# Patient Record
Sex: Male | Born: 1960 | Race: White | Hispanic: No | Smoking: Current every day smoker
Health system: Southern US, Community
[De-identification: ages and names within clinical notes are randomized; demographics above are authoritative.]

## PROBLEM LIST (undated history)

## (undated) DIAGNOSIS — N529 Male erectile dysfunction, unspecified: Secondary | ICD-10-CM

## (undated) DIAGNOSIS — C61 Malignant neoplasm of prostate: Secondary | ICD-10-CM

## (undated) DIAGNOSIS — E785 Hyperlipidemia, unspecified: Secondary | ICD-10-CM

## (undated) DIAGNOSIS — I10 Essential (primary) hypertension: Secondary | ICD-10-CM

## (undated) HISTORY — PX: OTHER SURGICAL HISTORY: SHX169

## (undated) HISTORY — DX: Hyperlipidemia, unspecified: E78.5

## (undated) HISTORY — DX: Essential (primary) hypertension: I10

## (undated) HISTORY — PX: TONSILLECTOMY: SUR1361

## (undated) HISTORY — DX: Male erectile dysfunction, unspecified: N52.9

## (undated) HISTORY — DX: Malignant neoplasm of prostate: C61

---

## 2012-04-08 ENCOUNTER — Ambulatory Visit (INDEPENDENT_AMBULATORY_CARE_PROVIDER_SITE_OTHER): Payer: PRIVATE HEALTH INSURANCE | Admitting: Family Medicine

## 2012-04-08 VITALS — BP 137/87 | HR 73 | Temp 97.6°F | Resp 16 | Ht 73.0 in | Wt 173.0 lb

## 2012-04-08 DIAGNOSIS — Z720 Tobacco use: Secondary | ICD-10-CM | POA: Insufficient documentation

## 2012-04-08 DIAGNOSIS — Z79899 Other long term (current) drug therapy: Secondary | ICD-10-CM

## 2012-04-08 DIAGNOSIS — R972 Elevated prostate specific antigen [PSA]: Secondary | ICD-10-CM | POA: Insufficient documentation

## 2012-04-08 DIAGNOSIS — I10 Essential (primary) hypertension: Secondary | ICD-10-CM

## 2012-04-08 DIAGNOSIS — E785 Hyperlipidemia, unspecified: Secondary | ICD-10-CM

## 2012-04-08 DIAGNOSIS — Z760 Encounter for issue of repeat prescription: Secondary | ICD-10-CM

## 2012-04-08 MED ORDER — AMLODIPINE BESYLATE 2.5 MG PO TABS: 2.5000 mg | ORAL_TABLET | Freq: Every day | ORAL | Status: DC

## 2012-04-08 MED ORDER — LOSARTAN POTASSIUM-HCTZ 100-25 MG PO TABS: 1.0000 | ORAL_TABLET | Freq: Every day | ORAL | Status: DC

## 2012-04-08 NOTE — Patient Instructions (Addendum)
Cholesterol Cholesterol is a white, waxy, fat-like protein needed by your body in small amounts. The liver makes all the cholesterol you need. It is carried from the liver by the blood through the blood vessels. Deposits (plaque) may build up on blood vessel walls. This makes the arteries narrower and stiffer. Plaque increases the risk for heart attack and stroke. You cannot feel your cholesterol level even if it is very high. The only way to know is by a blood test to check your lipid (fats) levels. Once you know your cholesterol levels, you should keep a record of the test results. Work with your caregiver to to keep your levels in the desired range. WHAT THE RESULTS MEAN:  Total cholesterol is a rough measure of all the cholesterol in your blood.   LDL is the so-called bad cholesterol. This is the type that deposits cholesterol in the walls of the arteries. You want this level to be low.   HDL is the good cholesterol because it cleans the arteries and carries the LDL away. You want this level to be high.   Triglycerides are fat that the body can either burn for energy or store. High levels are closely linked to heart disease.  DESIRED LEVELS:  Total cholesterol below 200.   LDL below 100 for people at risk, below 70 for very high risk.   HDL above 50 is good, above 60 is best.   Triglycerides below 150.  HOW TO LOWER YOUR CHOLESTEROL:  Diet.   Choose fish or white meat chicken and [place], roasted or baked. Limit fatty cuts of red meat, fried foods, and processed meats, such as sausage and lunch meat.   Eat lots of fresh fruits and vegetables. Choose whole grains, beans, pasta, potatoes and cereals.   Use only small amounts of olive, corn or canola oils. Avoid butter, mayonnaise, shortening or palm kernel oils. Avoid foods with trans-fats.   Use skim/nonfat milk and low-fat/nonfat yogurt and cheeses. Avoid whole milk, cream, ice cream, egg yolks and cheeses. Healthy desserts include  angel food cake, ginger snaps, animal crackers, hard candy, popsicles, and low-fat/nonfat frozen yogurt. Avoid pastries, cakes, pies and cookies.   Exercise.   A regular program helps decrease LDL and raises HDL.   Helps with weight control.   Do things that increase your activity level like gardening, walking, or taking the stairs.   Medication.   May be prescribed by your caregiver to help lowering cholesterol and the risk for heart disease.   You may need medicine even if your levels are normal if you have several risk factors.  HOME CARE INSTRUCTIONS   Follow your diet and exercise programs as suggested by your caregiver.   Take medications as directed.   Have blood work done when your caregiver feels it is necessary.  MAKE SURE YOU:   Understand these instructions.   Will watch your condition.   Will get help right away if you are not doing well or get worse.  Document Released: 04/04/2001 Document Revised: 06/29/2011 Document Reviewed: 09/25/2007 [facility] Patient Information 2012 [place], [place].Prostate-Specific Antigen The prostate-specific antigen (PSA) is a blood test. It is used to help detect early forms of prostate cancer. The test is usually used along with other tests. The test is also used to follow the course of those who already have prostate cancer or who have been treated for prostate cancer. Some factors interfere with the results of the PSA. The factors listed below will either increase or  decrease the PSA levels. They are:  Prescriptions used for male baldness.   Some herbs.   Active prostate infection.   Prior instrumentation or urinary catheterization.   Ejaculation up to 2 days prior to testing.   A noncancerous enlargement of the prostate.   Inflammation of the prostate.   Active urinary tract infection.  If your test results are elevated, your caregiver will discuss the results with you. Your caregiver will also let you know if more  evaluation is needed. PREPARATION FOR TEST No preparation or fasting is necessary. NORMAL FINDINGS Less than 4 ng/mL or Less than 41mcg/L (SI units) Ranges for normal findings may vary among different laboratories and hospitals. You should always check with your caregiver after having lab work or other tests done to discuss the meaning of your test results and whether your values are considered within normal limits. MEANING OF TEST  A normal value means prostate cancer is less likely. The chance of having prostate cancer increases if the value is between 4 ng/mL and 10 ng/mL. However, further testing will be needed. Values above 10 ng/mL indicate that there is a much higher chance of having prostate cancer (if the above situations that raise PSA are not present). Your caregiver will go over your test results with you and discuss the importance of this test. If this value is elevated, your caregiver may recommend further testing or evaluation. OBTAINING THE TEST RESULTS It is your responsibility to obtain your test results. Ask the lab or department performing the test when and how you will get your results. Document Released: 08/12/2004 Document Revised: 06/29/2011 Document Reviewed: 02/15/2007 [facility] Patient Information 2012 [place], [place].

## 2012-04-09 LAB — COMPREHENSIVE METABOLIC PANEL
AST: 19 U/L (ref 0–37)
Alkaline Phosphatase: 55 U/L (ref 39–117)
BUN: 12 mg/dL (ref 6–23)
Calcium: 9.4 mg/dL (ref 8.4–10.5)
Creat: 1.01 mg/dL (ref 0.50–1.35)
Total Bilirubin: 0.5 mg/dL (ref 0.3–1.2)

## 2012-04-09 LAB — LIPID PANEL
Cholesterol: 224 mg/dL — ABNORMAL HIGH (ref 0–200)
HDL: 46 mg/dL (ref >39)
Total CHOL/HDL Ratio: 4.9 Ratio
Triglycerides: 207 mg/dL — ABNORMAL HIGH (ref <150)
VLDL: 41 mg/dL — ABNORMAL HIGH (ref 0–40)

## 2012-04-10 ENCOUNTER — Other Ambulatory Visit: Payer: Self-pay | Admitting: Family Medicine

## 2012-04-10 DIAGNOSIS — E785 Hyperlipidemia, unspecified: Secondary | ICD-10-CM

## 2012-04-10 MED ORDER — PRAVASTATIN SODIUM 20 MG PO TABS: 20.0000 mg | ORAL_TABLET | Freq: Every day | ORAL | Status: DC

## 2012-04-10 NOTE — Progress Notes (Signed)
Reviewed and agree.

## 2012-04-10 NOTE — Progress Notes (Signed)
  Subjective:    Patient ID: Zachary Vance, male    DOB: [birth year], 51 y.o.   MRN: [id number]  HPI  Pt is doing well, no complaints. Does not watch diet - works in [employer] so no interest in trying a low salt or low fat diet. No prob tolerating losartan. No interest in stopping smoking. Only took simvastatin for a mo last yr - didn't have any refills on it so he just stopped it. Did not know about his elev PSA, never got eval by urology    Review of Systems  Constitutional: Negative for fever, chills, diaphoresis and fatigue.  Respiratory: Negative for chest tightness and shortness of breath.   Cardiovascular: Negative for chest pain, palpitations and leg swelling.  Gastrointestinal: Negative for anal bleeding and rectal pain.  Genitourinary: Negative for urgency, frequency, decreased urine volume and difficulty urinating.  Musculoskeletal: Negative for gait problem.       Objective:   Physical Exam  Constitutional: He is oriented to person, place, and time. He appears well-developed and well-nourished. No distress.  HENT:  Head: Normocephalic and atraumatic.  Right Ear: External ear normal.  Left Ear: External ear normal.  Eyes: Conjunctivae normal are normal. No scleral icterus.  Neck: Normal range of motion. Neck supple. No thyromegaly present.  Cardiovascular: Normal rate, regular rhythm, normal heart sounds and intact distal pulses.   Pulmonary/Chest: Effort normal and breath sounds normal. No respiratory distress.  Musculoskeletal: He exhibits no edema.  Lymphadenopathy:    He has no cervical adenopathy.  Neurological: He is alert and oriented to person, place, and time.  Skin: Skin is warm and dry. He is not diaphoretic.  Psychiatric: He has a normal mood and affect.          Assessment & Plan:  1. HTN - has been mildly elevated on max dose losartan-hctz today and at sev prev visits. Pt has no interest in TLC but is fine w/ starting another med.  Trial of low dose ccb - amlodipine. Recheck bp in 1-2 mos 2. HPL - last ldl in 170s - Framingham risk likely quite high considering pt's age, tob, htn.  Recheck then resume statin 3. Long term meds - check bmp since on losartan-hctz. Reviewed that he will need to RTC for alt 1-2 mos after starting statin - pt agreeable. 4. Tob use - pre-contemplative on cessation 5. Elev PSA - New info for pt, psa last yr 5.8. Recheck and refer to urology, asymptomatic.

## 2012-04-13 ENCOUNTER — Encounter: Payer: Self-pay | Admitting: Radiology

## 2012-04-23 ENCOUNTER — Telehealth: Payer: Self-pay

## 2012-04-23 NOTE — Telephone Encounter (Signed)
It was sent in per Epic. Call pharmacy in the am

## 2012-04-23 NOTE — Telephone Encounter (Signed)
Pt states dr Clelia Croft was to call in [facility] [address] Per pt never called in  Please call pt to advise

## 2012-04-24 MED ORDER — PRAVASTATIN SODIUM 20 MG PO TABS: 20.0000 mg | ORAL_TABLET | Freq: Every day | ORAL | Status: DC

## 2012-04-24 NOTE — Telephone Encounter (Signed)
[facility] [address]

## 2012-05-24 DIAGNOSIS — C61 Malignant neoplasm of prostate: Secondary | ICD-10-CM

## 2012-05-24 HISTORY — DX: Malignant neoplasm of prostate: C61

## 2012-06-07 ENCOUNTER — Encounter: Payer: Self-pay | Admitting: Family Medicine

## 2012-07-04 ENCOUNTER — Ambulatory Visit: Payer: Self-pay | Admitting: Radiation Oncology

## 2012-07-04 ENCOUNTER — Ambulatory Visit: Payer: Self-pay

## 2012-08-02 ENCOUNTER — Ambulatory Visit (INDEPENDENT_AMBULATORY_CARE_PROVIDER_SITE_OTHER): Payer: PRIVATE HEALTH INSURANCE | Admitting: Family Medicine

## 2012-08-02 ENCOUNTER — Encounter: Payer: Self-pay | Admitting: Family Medicine

## 2012-08-02 VITALS — BP 102/72 | HR 71 | Temp 98.0°F | Resp 16 | Ht 72.0 in | Wt 169.2 lb

## 2012-08-02 DIAGNOSIS — Z79899 Other long term (current) drug therapy: Secondary | ICD-10-CM

## 2012-08-02 DIAGNOSIS — E785 Hyperlipidemia, unspecified: Secondary | ICD-10-CM

## 2012-08-02 DIAGNOSIS — I10 Essential (primary) hypertension: Secondary | ICD-10-CM

## 2012-08-02 LAB — COMPREHENSIVE METABOLIC PANEL
AST: 23 U/L (ref 0–37)
Albumin: 4.5 g/dL (ref 3.5–5.2)
BUN: 16 mg/dL (ref 6–23)
Calcium: 9.3 mg/dL (ref 8.4–10.5)
Chloride: 97 mEq/L (ref 96–112)
Glucose, Bld: 87 mg/dL (ref 70–99)
Potassium: 3.3 mEq/L — ABNORMAL LOW (ref 3.5–5.3)
Sodium: 131 mEq/L — ABNORMAL LOW (ref 135–145)
Total Protein: 6.9 g/dL (ref 6.0–8.3)

## 2012-08-02 LAB — LIPID PANEL
Cholesterol: 211 mg/dL — ABNORMAL HIGH (ref 0–200)
HDL: 48 mg/dL
LDL Cholesterol: 122 mg/dL — ABNORMAL HIGH (ref 0–99)
Total CHOL/HDL Ratio: 4.4 ratio
Triglycerides: 207 mg/dL — ABNORMAL HIGH
VLDL: 41 mg/dL — ABNORMAL HIGH (ref 0–40)

## 2012-08-16 ENCOUNTER — Other Ambulatory Visit: Payer: Self-pay | Admitting: Urology

## 2012-09-07 ENCOUNTER — Other Ambulatory Visit: Payer: Self-pay

## 2012-09-11 ENCOUNTER — Encounter (HOSPITAL_COMMUNITY): Payer: Self-pay | Admitting: Pharmacy Technician

## 2012-09-13 NOTE — Patient Instructions (Signed)
Zachary Vance  09/13/2012   Your procedure is scheduled on:  09/25/12   Report to [facility] at  0630  AM.  Call this number if you have problems the morning of surgery: [phone]   Remember:   Do not eat food or drink liquids after midnight.   Take these medicines the morning of surgery with A SIP OF WATER:    Do not wear jewelry,  Do not wear lotions, powders, or perfumes. .   Men may shave face and neck.  Do not bring valuables to the hospital.  Contacts, dentures or bridgework may not be worn into surgery.  Leave suitcase in the car. After surgery it may be brought to your room.  For patients admitted to the hospital, checkout time is 11:00 AM the day of  discharge.     SEE CHG INSTRUCTION SHEET    Please read over the following fact sheets that you were given: MRSA Information, coughing and deep breathing exercises, leg exercises, Blood Transfusion Fact Sheet                Failure to comply with these instructions may result in cancellation of your surgery.                Patient Signature ____________________________              Nurse Signature _____________________________

## 2012-09-16 ENCOUNTER — Encounter (HOSPITAL_COMMUNITY)
Admission: RE | Admit: 2012-09-16 | Discharge: 2012-09-16 | Disposition: A | Discharge status: Home / Self Care | Payer: PRIVATE HEALTH INSURANCE | Source: Ambulatory Visit | Attending: Urology | Admitting: Urology

## 2012-09-16 ENCOUNTER — Encounter (HOSPITAL_COMMUNITY): Payer: Self-pay

## 2012-09-16 ENCOUNTER — Ambulatory Visit (HOSPITAL_COMMUNITY)
Admission: RE | Admit: 2012-09-16 | Discharge: 2012-09-16 | Disposition: A | Discharge status: Home / Self Care | Payer: PRIVATE HEALTH INSURANCE | Source: Ambulatory Visit | Attending: Urology | Admitting: Urology

## 2012-09-16 DIAGNOSIS — I10 Essential (primary) hypertension: Secondary | ICD-10-CM | POA: Insufficient documentation

## 2012-09-16 DIAGNOSIS — R911 Solitary pulmonary nodule: Secondary | ICD-10-CM | POA: Insufficient documentation

## 2012-09-16 DIAGNOSIS — Z01810 Encounter for preprocedural cardiovascular examination: Secondary | ICD-10-CM | POA: Insufficient documentation

## 2012-09-16 DIAGNOSIS — Z01812 Encounter for preprocedural laboratory examination: Secondary | ICD-10-CM | POA: Insufficient documentation

## 2012-09-16 DIAGNOSIS — Z01818 Encounter for other preprocedural examination: Secondary | ICD-10-CM | POA: Insufficient documentation

## 2012-09-16 LAB — BASIC METABOLIC PANEL
BUN: 12 mg/dL (ref 6–23)
CO2: 25 mEq/L (ref 19–32)
Chloride: 94 mEq/L — ABNORMAL LOW (ref 96–112)
Creatinine, Ser: 1.1 mg/dL (ref 0.50–1.35)

## 2012-09-16 LAB — CBC
HCT: 43.7 % (ref 39.0–52.0)
Hemoglobin: 15 g/dL (ref 13.0–17.0)
MCV: 87.6 fL (ref 78.0–100.0)
Platelets: 265 10*3/uL (ref 150–400)
RBC: 4.99 MIL/uL (ref 4.22–5.81)
WBC: 5.8 10*3/uL (ref 4.0–10.5)

## 2012-09-16 NOTE — Progress Notes (Signed)
CXR results faxed via EPIC to Dr Isabel Caprice.

## 2012-09-24 NOTE — H&P (Signed)
History of Present Illness        Prostate cancer history:     Zachary Vance presented earlier in the fall with a PSA of 5.09. Repeat PSA in the office return 5.69. He underwent ultrasound and biopsy of his prostate on 05/24/2012. Prostatic volume was 23 cc. 5/12 biopsies revealed adenocarcinoma, all Gleason 3+3. Both biopsies of the left base revealed adenocarcinoma, the left mid medial and left apex medial zones had positive biopsies, and the right apex medial biopsy showed adenocarcinoma.  Based on the Kattan nomogram, he has an approximate 90% chance of having organ confined disease, and basically a 1% chance of having seminal vesicle or nodal involvement. Cancer free survival with surgery at 5 and 10 years is approximately 97 and 95%. Cancer free survival with brachytherapy at 5 years is approximately 90%.  He does have pre-existing erectile dysfunction. He denies any significant lower urinary tract symptoms.  Dad probably had prostate cancer. ( still living)   AUA score 7/1      SHIM  18/25   Past Medical History Problems  1. History of  Hypercholesterolemia 272.0 2. History of  Hypertension 401.9  Surgical History Problems  1. History of  Tonsillectomy  Current Meds 1. AmLODIPine Besylate 2.5 MG Oral Tablet; Therapy: (Recorded:18Oct2013) to 2. Ibuprofen CAPS; as needed; Therapy: (Recorded:18Oct2013) to 3. Levofloxacin 500 MG Oral Tablet; One tablet the day before procedure, the day of procedure, and  the day after procedure; Therapy: 21Oct2013 to (Last Rx:21Oct2013)  Requested for: 21Oct2013 4. Losartan Potassium-HCTZ 100-25 MG Oral Tablet; Therapy: (Recorded:18Oct2013) to 5. Pravastatin Sodium 20 MG Oral Tablet; Therapy: (Recorded:18Oct2013) to  Allergies Medication  1. No Known Drug Allergies  Family History Problems  1. Family history of  Family Health Status Number Of Children 2 sons & 1 daughter  Social History Problems  1. Alcohol Use 3-4 2. Caffeine Use 2 3. Current  Smoker 305.1 Has smoked 1 ppd for 35 yrs 4. Marital History - Single 5. Occupation: Production designer, theatre/television/film  Review of Systems Genitourinary, constitutional, skin, eye, otolaryngeal, hematologic/lymphatic, cardiovascular, pulmonary, endocrine, musculoskeletal, gastrointestinal, neurological and psychiatric system(s) were reviewed and pertinent findings if present are noted.  Genitourinary: erectile dysfunction.    Vitals Vital Signs [Data Includes: Last 1 Day]  Blood Pressure: 134 / 83 Temperature: 97.8 F Heart Rate: 90  Physical Exam Constitutional: Well nourished and well developed . No acute distress.  Neck: The appearance of the neck is normal and no neck mass is present.  Pulmonary: No respiratory distress and normal respiratory rhythm and effort.  Cardiovascular: Heart rate and rhythm are normal . No peripheral edema.  Abdomen: The abdomen is soft and nontender. No masses are palpated. No CVA tenderness. No hernias are palpable. No hepatosplenomegaly noted.  Genitourinary: Examination of the penis demonstrates no discharge, no masses, no lesions and a normal meatus. The scrotum is without lesions. The right epididymis is palpably normal and non-tender. The left epididymis is palpably normal and non-tender. The right testis is non-tender and without masses. The left testis is non-tender and without masses.  Skin: Normal skin turgor, no visible rash and no visible skin lesions.  Neuro/Psych:. Mood and affect are appropriate.    Assessment Assessed  1. Adenocarcinoma Of The Prostate Gland 185  Discussion/Summary  The patient was counseled about the natural history of prostate cancer and the standard treatment options that are available for prostate cancer. It was explained to him how his age and life expectancy, clinical stage, Gleason score, and PSA affect  his prognosis, the decision to proceed with additional staging studies, as well as how that information influences recommended treatment  strategies. We discussed the roles for active surveillance, radiation therapy, surgical therapy, androgen deprivation, as well as ablative therapy options for the treatment of prostate cancer as appropriate to his individual cancer situation. We discussed the risks and benefits of these options with regard to their impact on cancer control and also in terms of potential adverse events, complications, and impact on quiality of life particularly related to urinary, bowel, and sexual function. The patient was encouraged to ask questions throughout the discussion today and all questions were answered to his stated satisfaction. In addition, the patient was provided with and/or directed to appropriate resources and literature for further education about prostate cancer and treatment options.   We discussed surgical therapy for prostate cancer including the different available surgical approaches. We discussed, in detail, the risks and expectations of surgery with regard to cancer control, urinary control, and erectile function as well as the expected postoperative recovery process. The risks, potential complications/adverse events of radical prostatectomy as well as alternative options were explained to the patient.   We discussed surgical therapy for prostate cancer including the different available surgical approaches. We discussed, in detail, the risks and expectations of surgery with regard to cancer control, urinary control, and erectile function as well as the expected postoperative recovery process. Additional risks of surgery including but not limited to bleeding, infection, hernia formation, nerve damage, lymphocele formation, bowel/rectal injury potentially necessitating colostomy, damage to the urinary tract resulting in urine leakage, urethral stricture, and the cardiopulmonary risks such as myocardial infarction, stroke, death, venothromboembolism, etc. were explained. The risk of open surgical conversion  for robotic/laparoscopic prostatectomy was also discussed.   45 minutes were spent in face to face consultation with patient today.  A total of 45 minutes were spent in the overall care of the patient today    Amendment  He would like to proceed with robotic prostatectomy. I will plan on bilateral nerve spare without lymph node dissection.

## 2012-09-24 NOTE — Anesthesia Preprocedure Evaluation (Addendum)
Anesthesia Evaluation  Patient identified by MRN, date of birth, ID band Patient awake    Reviewed: Allergy & Precautions, H&P , NPO status , Patient's Chart, lab work & pertinent test results  Airway Mallampati: II TM Distance: >3 FB Neck ROM: Full    Dental  (+) Dental Advisory Given and Teeth Intact   Pulmonary Current Smoker,  breath sounds clear to auscultation  Pulmonary exam normal       Cardiovascular hypertension, Pt. on medications Rhythm:Regular Rate:Normal     Neuro/Psych PSYCHIATRIC DISORDERS negative neurological ROS  negative psych ROS   GI/Hepatic negative GI ROS, Neg liver ROS,   Endo/Other  negative endocrine ROS  Renal/GU negative Renal ROS  negative genitourinary   Musculoskeletal negative musculoskeletal ROS (+)   Abdominal   Peds negative pediatric ROS (+)  Hematology negative hematology ROS (+)   Anesthesia Other Findings   Reproductive/Obstetrics                          Anesthesia Physical Anesthesia Plan  ASA: II  Anesthesia Plan: General   Post-op Pain Management:    Induction: Intravenous  Airway Management Planned: Oral ETT  Additional Equipment:   Intra-op Plan:   Post-operative Plan: Extubation in OR  Informed Consent: I have reviewed the patients History and Physical, chart, labs and discussed the procedure including the risks, benefits and alternatives for the proposed anesthesia with the patient or authorized representative who has indicated his/her understanding and acceptance.   Dental advisory given  Plan Discussed with: CRNA  Anesthesia Plan Comments:        Anesthesia Quick Evaluation

## 2012-09-25 ENCOUNTER — Observation Stay (HOSPITAL_COMMUNITY)
Admission: RE | Admit: 2012-09-25 | Discharge: 2012-09-26 | Disposition: A | Discharge status: Home / Self Care | Payer: PRIVATE HEALTH INSURANCE | Source: Ambulatory Visit | Attending: Urology | Admitting: Urology

## 2012-09-25 ENCOUNTER — Encounter (HOSPITAL_COMMUNITY)
Admission: RE | Disposition: A | Discharge status: Home / Self Care | Payer: Self-pay | Source: Ambulatory Visit | Attending: Urology

## 2012-09-25 ENCOUNTER — Inpatient Hospital Stay (HOSPITAL_COMMUNITY): Payer: PRIVATE HEALTH INSURANCE | Admitting: Anesthesiology

## 2012-09-25 ENCOUNTER — Encounter (HOSPITAL_COMMUNITY): Payer: Self-pay | Admitting: Anesthesiology

## 2012-09-25 ENCOUNTER — Encounter (HOSPITAL_COMMUNITY): Payer: Self-pay | Admitting: *Deleted

## 2012-09-25 DIAGNOSIS — Z79899 Other long term (current) drug therapy: Secondary | ICD-10-CM | POA: Insufficient documentation

## 2012-09-25 DIAGNOSIS — E7800 Pure hypercholesterolemia, unspecified: Secondary | ICD-10-CM | POA: Insufficient documentation

## 2012-09-25 DIAGNOSIS — C61 Malignant neoplasm of prostate: Principal | ICD-10-CM | POA: Insufficient documentation

## 2012-09-25 DIAGNOSIS — I10 Essential (primary) hypertension: Secondary | ICD-10-CM | POA: Insufficient documentation

## 2012-09-25 HISTORY — PX: ROBOT ASSISTED LAPAROSCOPIC RADICAL PROSTATECTOMY: SHX5141

## 2012-09-25 LAB — HEMOGLOBIN AND HEMATOCRIT, BLOOD: Hemoglobin: 12.8 g/dL — ABNORMAL LOW (ref 13.0–17.0)

## 2012-09-25 LAB — TYPE AND SCREEN: ABO/RH(D): A POS

## 2012-09-25 LAB — ABO/RH: ABO/RH(D): A POS

## 2012-09-25 SURGERY — ROBOTIC ASSISTED LAPAROSCOPIC RADICAL PROSTATECTOMY
Anesthesia: General | Wound class: Clean Contaminated

## 2012-09-25 MED ORDER — CEFAZOLIN SODIUM-DEXTROSE 2-3 GM-% IV SOLR
2.0000 g | INTRAVENOUS | Status: AC
  Administered 2012-09-25: 2 g via INTRAVENOUS

## 2012-09-25 MED ORDER — LACTATED RINGERS IR SOLN
Status: DC | PRN
  Administered 2012-09-25: 1000 mL

## 2012-09-25 MED ORDER — PROPOFOL 10 MG/ML IV BOLUS
INTRAVENOUS | Status: DC | PRN
  Administered 2012-09-25: 180 mg via INTRAVENOUS
  Administered 2012-09-25: 20 mg via INTRAVENOUS

## 2012-09-25 MED ORDER — OXYCODONE HCL 5 MG/5ML PO SOLN: 5.0000 mg | Freq: Once | ORAL | Status: DC | PRN

## 2012-09-25 MED ORDER — ACETAMINOPHEN 10 MG/ML IV SOLN
INTRAVENOUS | Status: AC
  Filled 2012-09-25: qty 100

## 2012-09-25 MED ORDER — DEXTROSE-NACL 5-0.45 % IV SOLN
INTRAVENOUS | Status: DC
  Administered 2012-09-25 – 2012-09-26 (×2): via INTRAVENOUS

## 2012-09-25 MED ORDER — HYDROCHLOROTHIAZIDE 25 MG PO TABS
25.0000 mg | ORAL_TABLET | Freq: Every day | ORAL | Status: DC
  Administered 2012-09-26: 25 mg via ORAL
  Filled 2012-09-25 (×2): qty 1

## 2012-09-25 MED ORDER — LIDOCAINE HCL (CARDIAC) 20 MG/ML IV SOLN
INTRAVENOUS | Status: DC | PRN
  Administered 2012-09-25: 80 mg via INTRAVENOUS

## 2012-09-25 MED ORDER — NEOSTIGMINE METHYLSULFATE 1 MG/ML IJ SOLN
INTRAMUSCULAR | Status: DC | PRN
  Administered 2012-09-25: 4 mg via INTRAVENOUS

## 2012-09-25 MED ORDER — LOSARTAN POTASSIUM-HCTZ 100-25 MG PO TABS: 1.0000 | ORAL_TABLET | Freq: Every day | ORAL | Status: DC

## 2012-09-25 MED ORDER — MEPERIDINE HCL 50 MG/ML IJ SOLN: 6.2500 mg | INTRAMUSCULAR | Status: DC | PRN

## 2012-09-25 MED ORDER — INDIGOTINDISULFONATE SODIUM 8 MG/ML IJ SOLN
INTRAMUSCULAR | Status: DC | PRN
  Administered 2012-09-25 (×2): 40 mg via INTRAVENOUS

## 2012-09-25 MED ORDER — HYDROMORPHONE HCL PF 1 MG/ML IJ SOLN
INTRAMUSCULAR | Status: AC
  Filled 2012-09-25: qty 1

## 2012-09-25 MED ORDER — HYDROCODONE-ACETAMINOPHEN 5-325 MG PO TABS: 1.0000 | ORAL_TABLET | ORAL | Status: DC | PRN

## 2012-09-25 MED ORDER — GLYCOPYRROLATE 0.2 MG/ML IJ SOLN
INTRAMUSCULAR | Status: DC | PRN
  Administered 2012-09-25: .6 mg via INTRAVENOUS

## 2012-09-25 MED ORDER — SUCCINYLCHOLINE CHLORIDE 20 MG/ML IJ SOLN
INTRAMUSCULAR | Status: DC | PRN
  Administered 2012-09-25: 100 mg via INTRAVENOUS

## 2012-09-25 MED ORDER — OXYCODONE HCL 5 MG PO TABS: 5.0000 mg | ORAL_TABLET | Freq: Once | ORAL | Status: DC | PRN

## 2012-09-25 MED ORDER — ACETAMINOPHEN 10 MG/ML IV SOLN
1000.0000 mg | Freq: Four times a day (QID) | INTRAVENOUS | Status: AC
  Administered 2012-09-25 – 2012-09-26 (×4): 1000 mg via INTRAVENOUS
  Filled 2012-09-25 (×4): qty 100

## 2012-09-25 MED ORDER — SIMVASTATIN 10 MG PO TABS
10.0000 mg | ORAL_TABLET | Freq: Every day | ORAL | Status: DC
  Administered 2012-09-25: 10 mg via ORAL
  Filled 2012-09-25 (×2): qty 1

## 2012-09-25 MED ORDER — ACETAMINOPHEN 10 MG/ML IV SOLN
INTRAVENOUS | Status: DC | PRN
  Administered 2012-09-25: 1000 mg via INTRAVENOUS

## 2012-09-25 MED ORDER — ONDANSETRON HCL 4 MG/2ML IJ SOLN
INTRAMUSCULAR | Status: DC | PRN
  Administered 2012-09-25: 4 mg via INTRAVENOUS

## 2012-09-25 MED ORDER — PROMETHAZINE HCL 25 MG/ML IJ SOLN: 6.2500 mg | INTRAMUSCULAR | Status: DC | PRN

## 2012-09-25 MED ORDER — SODIUM CHLORIDE 0.9 % IV BOLUS (SEPSIS): 1000.0000 mL | Freq: Once | INTRAVENOUS | Status: DC

## 2012-09-25 MED ORDER — LACTATED RINGERS IV SOLN
INTRAVENOUS | Status: DC | PRN
  Administered 2012-09-25: 10:00:00

## 2012-09-25 MED ORDER — AMLODIPINE BESYLATE 2.5 MG PO TABS
2.5000 mg | ORAL_TABLET | Freq: Every day | ORAL | Status: DC
  Filled 2012-09-25: qty 1

## 2012-09-25 MED ORDER — MIDAZOLAM HCL 5 MG/5ML IJ SOLN
INTRAMUSCULAR | Status: DC | PRN
  Administered 2012-09-25: 2 mg via INTRAVENOUS

## 2012-09-25 MED ORDER — ACETAMINOPHEN 10 MG/ML IV SOLN: 1000.0000 mg | Freq: Once | INTRAVENOUS | Status: DC | PRN

## 2012-09-25 MED ORDER — ROCURONIUM BROMIDE 100 MG/10ML IV SOLN
INTRAVENOUS | Status: DC | PRN
  Administered 2012-09-25: 50 mg via INTRAVENOUS
  Administered 2012-09-25: 5 mg via INTRAVENOUS
  Administered 2012-09-25: 2.5 mg via INTRAVENOUS
  Administered 2012-09-25: 30 mg via INTRAVENOUS

## 2012-09-25 MED ORDER — CIPROFLOXACIN HCL 500 MG PO TABS: 500.0000 mg | ORAL_TABLET | Freq: Two times a day (BID) | ORAL | Status: DC

## 2012-09-25 MED ORDER — HYDROCODONE-ACETAMINOPHEN 5-325 MG PO TABS: 1.0000 | ORAL_TABLET | Freq: Four times a day (QID) | ORAL | Status: DC | PRN

## 2012-09-25 MED ORDER — BUPIVACAINE-EPINEPHRINE 0.25% -1:200000 IJ SOLN
INTRAMUSCULAR | Status: DC | PRN
  Administered 2012-09-25: 26 mL

## 2012-09-25 MED ORDER — SODIUM CHLORIDE 0.9 % IR SOLN
Status: DC | PRN
  Administered 2012-09-25: 1000 mL via INTRAVESICAL

## 2012-09-25 MED ORDER — HYDROMORPHONE HCL PF 1 MG/ML IJ SOLN
0.2500 mg | INTRAMUSCULAR | Status: DC | PRN
  Administered 2012-09-25 (×2): 0.5 mg via INTRAVENOUS

## 2012-09-25 MED ORDER — EPHEDRINE SULFATE 50 MG/ML IJ SOLN
INTRAMUSCULAR | Status: DC | PRN
  Administered 2012-09-25 (×2): 5 mg via INTRAVENOUS

## 2012-09-25 MED ORDER — LACTATED RINGERS IV SOLN
INTRAVENOUS | Status: DC | PRN
  Administered 2012-09-25 (×2): via INTRAVENOUS

## 2012-09-25 MED ORDER — LOSARTAN POTASSIUM 50 MG PO TABS
100.0000 mg | ORAL_TABLET | Freq: Every day | ORAL | Status: DC
  Administered 2012-09-25 – 2012-09-26 (×2): 100 mg via ORAL
  Filled 2012-09-25 (×2): qty 2

## 2012-09-25 MED ORDER — FENTANYL CITRATE 0.05 MG/ML IJ SOLN
INTRAMUSCULAR | Status: DC | PRN
  Administered 2012-09-25 (×5): 50 ug via INTRAVENOUS

## 2012-09-25 MED ORDER — MORPHINE SULFATE 2 MG/ML IJ SOLN: 2.0000 mg | INTRAMUSCULAR | Status: DC | PRN

## 2012-09-25 MED ORDER — CEFAZOLIN SODIUM 1-5 GM-% IV SOLN
INTRAVENOUS | Status: AC
  Filled 2012-09-25: qty 100

## 2012-09-25 SURGICAL SUPPLY — 49 items
APPLICATOR SURGIFLO ENDO (HEMOSTASIS) ×2 IMPLANT
CANISTER SUCTION 2500CC (MISCELLANEOUS) ×2 IMPLANT
CATH FOLEY 2WAY SLVR 18FR 30CC (CATHETERS) ×2 IMPLANT
CATH ROBINSON RED A/P 16FR (CATHETERS) ×2 IMPLANT
CATH ROBINSON RED A/P 8FR (CATHETERS) ×2 IMPLANT
CATH TIEMANN FOLEY 18FR 5CC (CATHETERS) ×2 IMPLANT
CHLORAPREP W/TINT 26ML (MISCELLANEOUS) ×2 IMPLANT
CLIP LIGATING HEM O LOK PURPLE (MISCELLANEOUS) ×2 IMPLANT
CLOTH BEACON ORANGE TIMEOUT ST (SAFETY) ×2 IMPLANT
CORD HIGH FREQUENCY UNIPOLAR (ELECTROSURGICAL) ×2 IMPLANT
COVER SURGICAL LIGHT HANDLE (MISCELLANEOUS) ×2 IMPLANT
COVER TIP SHEARS 8 DVNC (MISCELLANEOUS) ×1 IMPLANT
COVER TIP SHEARS 8MM DA VINCI (MISCELLANEOUS) ×1
CUTTER ECHEON FLEX ENDO 45 340 (ENDOMECHANICALS) ×2 IMPLANT
DECANTER SPIKE VIAL GLASS SM (MISCELLANEOUS) ×2 IMPLANT
DRAPE SURG IRRIG POUCH 19X23 (DRAPES) ×2 IMPLANT
DRSG TEGADERM 2-3/8X2-3/4 SM (GAUZE/BANDAGES/DRESSINGS) ×8 IMPLANT
DRSG TEGADERM 4X4.75 (GAUZE/BANDAGES/DRESSINGS) ×4 IMPLANT
DRSG TEGADERM 6X8 (GAUZE/BANDAGES/DRESSINGS) ×6 IMPLANT
ELECT REM PT RETURN 9FT ADLT (ELECTROSURGICAL) ×2
ELECTRODE REM PT RTRN 9FT ADLT (ELECTROSURGICAL) ×1 IMPLANT
GAUZE SPONGE 2X2 8PLY STRL LF (GAUZE/BANDAGES/DRESSINGS) ×1 IMPLANT
GLOVE BIO SURGEON STRL SZ 6.5 (GLOVE) ×2 IMPLANT
GLOVE BIOGEL M STRL SZ7.5 (GLOVE) ×4 IMPLANT
GOWN PREVENTION PLUS XLARGE (GOWN DISPOSABLE) ×2 IMPLANT
GOWN STRL NON-REIN LRG LVL3 (GOWN DISPOSABLE) ×2 IMPLANT
GOWN STRL REIN XL XLG (GOWN DISPOSABLE) ×4 IMPLANT
HEMOSTAT SURGICEL 2X3 (HEMOSTASIS) ×2 IMPLANT
HEMOSTAT SURGICEL 4X8 (HEMOSTASIS) ×2 IMPLANT
HOLDER FOLEY CATH W/STRAP (MISCELLANEOUS) ×2 IMPLANT
IV LACTATED RINGERS 1000ML (IV SOLUTION) ×2 IMPLANT
KIT ACCESSORY DA VINCI DISP (KITS) ×1
KIT ACCESSORY DVNC DISP (KITS) ×1 IMPLANT
NDL SAFETY ECLIPSE 18X1.5 (NEEDLE) ×1 IMPLANT
NEEDLE HYPO 18GX1.5 SHARP (NEEDLE) ×1
PACK ROBOT UROLOGY CUSTOM (CUSTOM PROCEDURE TRAY) ×2 IMPLANT
RELOAD GREEN ECHELON 45 (STAPLE) ×2 IMPLANT
SEALER TISSUE G2 CVD JAW 45CM (ENDOMECHANICALS) ×2 IMPLANT
SET TUBE IRRIG SUCTION NO TIP (IRRIGATION / IRRIGATOR) ×2 IMPLANT
SOLUTION ELECTROLUBE (MISCELLANEOUS) ×2 IMPLANT
SPONGE GAUZE 2X2 STER 10/PKG (GAUZE/BANDAGES/DRESSINGS) ×1
SURGIFLO W/THROMBIN 8M KIT (HEMOSTASIS) ×2 IMPLANT
SUT ETHILON 3 0 PS 1 (SUTURE) ×4 IMPLANT
SUT VIC AB 2-0 SH 27 (SUTURE) ×1
SUT VIC AB 2-0 SH 27X BRD (SUTURE) ×1 IMPLANT
SUT VICRYL 0 UR6 27IN ABS (SUTURE) ×2 IMPLANT
SYR 27GX1/2 1ML LL SAFETY (SYRINGE) ×2 IMPLANT
TOWEL OR NON WOVEN STRL DISP B (DISPOSABLE) ×2 IMPLANT
WATER STERILE IRR 1500ML POUR (IV SOLUTION) ×4 IMPLANT

## 2012-09-25 NOTE — Care Management Note (Addendum)
    Page 1 of 1   09/26/2012     10:55:12 AM   CARE MANAGEMENT NOTE 09/26/2012  Patient:  Zachary Vance, Zachary Vance   Account Number:  [id number]  Date Initiated:  09/25/2012  Documentation initiated by:  Lanier Clam  Subjective/Objective Assessment:   ADMITTED W/PROSTATE CA.     Action/Plan:   FROM HOME.HAS PCP,PHARMACY.   Anticipated DC Date:  09/26/2012   Anticipated DC Plan:  HOME/SELF CARE      DC Planning Services  CM consult      Choice offered to / List presented to:             Status of service:  Completed, signed off Medicare Important Message given?   (If response is "NO", the following Medicare IM given date fields will be blank) Date Medicare IM given:   Date Additional Medicare IM given:    Discharge Disposition:  HOME/SELF CARE  Per UR Regulation:  Reviewed for med. necessity/level of care/duration of stay  If discussed at Long Length of Stay Meetings, dates discussed:    Comments:  09/26/12 KATHY MAHABIR RN,BSN NCM [phone] NO ANTICIPATED D/C NEEDS.  09/25/12 KATHY MAHABIR RN,BSN NCM [phone] S/P LAP RADICAL RETROPUBIC PROSTATECTOMY.

## 2012-09-25 NOTE — Anesthesia Postprocedure Evaluation (Signed)
Anesthesia Post Note  Patient: Zachary Vance  Procedure(s) Performed: Procedure(s) (LRB): ROBOTIC ASSISTED LAPAROSCOPIC RADICAL PROSTATECTOMY (N/A)  Anesthesia type: General  Patient location: PACU  Post pain: Pain level controlled  Post assessment: Post-op Vital signs reviewed  Last Vitals: BP 123/73  Pulse 68  Temp(Src) 36.8 C (Oral)  Resp 16  Ht 6\' 2"  (1.88 m)  Wt 170 lb (77.111 kg)  BMI 21.82 kg/m2  SpO2 100%  Post vital signs: Reviewed  Level of consciousness: sedated  Complications: No apparent anesthesia complications

## 2012-09-25 NOTE — Preoperative (Signed)
Beta Blockers   Reason not to administer Beta Blockers:Not Applicable 

## 2012-09-25 NOTE — Op Note (Signed)
Preoperative diagnosis: Clinical stage T1c Adenocarcinoma prostate  Postoperative diagnosis: Same  Procedure: Robotic-assisted laparoscopic radical retropubic prostatectomy  Surgeon: Valetta Fuller, MD  Asst.: Pecola Leisure, PA Anesthesia: Gen. Endotracheal  Indications: Patient was diagnosed with clinical stage TIc Adenocarcinoma the prostate. He underwent extensive consultation with regard to treatment options. The patient decided on a surgical approach. He appeared to understand the distinct advantages as well as the disadvantages of this procedure. The patient has performed a mechanical bowel prep. He has had placement of PAS compression boots and has received perioperative antibiotics. The patient's preoperative PSA was 5.7. Ultrasound revealed a 23 g prostate.   Technique and findings:The patient was brought to the operating room and had successful induction of general endotracheal anesthesia.the patient was placed in a low lithotomy position with careful padding of all extremities. He was secured to the operative table and placed in the steep Trendelenburg position. He was prepped and draped in usual manner. A Foley catheter was placed sterilely on the field. Camera port site was chosen 18 cm above the pubic symphysis just to the left of the umbilicus. A standard open Hassan technique was utilized. A 12 mm trocar was placed without difficulty. The camera was then inserted and no abnormalities were noted within the pelvis. The trochars were placed with direct visual guidance. This included 3 8mm robotic trochars and a 12 mm and 5 mm assist ports. Once all the ports were placed the robot was docked. The bladder was filled and the space of Retzius was developed with electrocautery dissection as well as blunt dissection. Superficial fat off the endopelvic fascia and bladder neck was removed with electrocautery scissors. The endopelvic fascia was then incised bilaterally from base to apex. Levator  musculature was swept off the apex of the prostate isolating the dorsal venous complex which was then stapled with the ETS stapling device. The anterior bladder neck was identified with the aid of the Foley balloon. This was then transected down to the Foley catheter with electrocautery scissors. The Foley catheter was then retracted anteriorly. Indigo carmine was given and we appeared to be well away from the ureteral orifices. The posterior bladder neck was then transected and the dissection carried down to the adnexal structures. The seminal vesicles and vas deferens on both sides were then individually dissected free and retracted anteriorly. The posterior plane between the rectum and prostate was then established primarily with blunt dissection.  Attention was then turned towards nerve sparing. The patient was felt to be a candidate for bilateral nerve sparing. Superficial fascia along the anterior lateral aspect of the prostate was incised bilaterally. This tissue was then swept laterally until we were able to establish a groove between the neurovascular tissue and the posterior lateral aspect on the prostate bilaterally. This groove was then extended from the apex back to the base of the prostate. With the prostate retracted anteriorly the vascular pedicles of the prostate were taken with the Enseal device. The Foley catheter was then reinserted and the anterior urethra was transected. The posterior urethra was then transected as were some rectourethralis fibers. The prostate was then removed from the pelvis. The pelvis was then copiously irrigated. Rectal insufflation was performed and there was no evidence of rectal injury.    Attention was then turned towards reconstruction. The bladder neck did not require any reconstruction. The bladder neck and posterior urethra were reapproximated at the 6:00 position utilizing a 2-0 Vicryl suture. The rest of the anastomosis was done with a  double-armed 3-0  Monocryl suture in a 360 degree manner. Additional indigo carmine was given. A new catheter was placed and bladder irrigation revealed no evidence of leakage. A Blake drain was placed through one of the robotic trochars and positioned in the retropubic space above the anastomosis. This was then secured to the skin with a nylon suture. The prostate was placed in the Endopouch retrieval bag. The 12 mm trocar site was closed with a Vicryl suture with the aid of a suture passer. Our other trochars were taken out with direct visual guidance without evidence of any bleeding. The camera port incision was extended slightly to allow for removal of the specimen and then closed with a running Vicryl suture. All port sites were infiltrated with Marcaine and then closed with surgical clips. The patient was then taken to recovery room having had no obvious complications or problems. Sponge and needle counts were correct.

## 2012-09-25 NOTE — Progress Notes (Signed)
Patient ID: Zachary Vance, male   DOB: 1961/03/18, 52 y.o.   MRN: [id number] Post-op note  Subjective: The patient is doing well.  No complaints.  Denies N/V  Objective: Vital signs in last 24 hours: Temp:  [97.7 F (36.5 C)-98.3 F (36.8 C)] 98.3 F (36.8 C) (03/05 1255) Pulse Rate:  [65-101] 68 (03/05 1255) Resp:  [11-18] 16 (03/05 1255) BP: (110-155)/(73-94) 123/73 mmHg (03/05 1255) SpO2:  [98 %-100 %] 100 % (03/05 1255) Weight:  [77.111 kg (170 lb)] 77.111 kg (170 lb) (03/05 1347)  Intake/Output from previous day:   Intake/Output this shift: Total I/O In: 1200 [I.V.:1200] Out: 240 [Urine:230; Drains:10]  Physical Exam:  General: Alert and oriented. Abdomen: Soft, Nondistended. Incisions: Clean and dry.  Lab Results:  Recent Labs  09/25/12 1410  HGB 12.8*  HCT 36.9*    Assessment/Plan: POD#0   1) Continue to monitor 20 DVT prophy, amb, IS, pain control    LOS: 0 days   [name]. 09/25/2012, 3:14 PM

## 2012-09-25 NOTE — Transfer of Care (Signed)
Immediate Anesthesia Transfer of Care Note  Patient: Zachary Vance  Procedure(s) Performed: Procedure(s): ROBOTIC ASSISTED LAPAROSCOPIC RADICAL PROSTATECTOMY (N/A)  Patient Location: PACU  Anesthesia Type:General  Level of Consciousness: awake, alert , oriented and patient cooperative  Airway & Oxygen Therapy: Patient Spontanous Breathing and Patient connected to face mask oxygen  Post-op Assessment: Report given to PACU RN, Post -op Vital signs reviewed and stable and Patient moving all extremities  Post vital signs: Reviewed and stable  Complications: No apparent anesthesia complications

## 2012-09-25 NOTE — Interval H&P Note (Signed)
History and Physical Interval Note:  09/25/2012 8:28 AM  Zachary Vance  has presented today for surgery, with the diagnosis of PROSTATE CANCER  The various methods of treatment have been discussed with the patient and family. After consideration of risks, benefits and other options for treatment, the patient has consented to  Procedure(s): ROBOTIC ASSISTED LAPAROSCOPIC RADICAL PROSTATECTOMY (N/A) as a surgical intervention .  The patient's history has been reviewed, patient examined, no change in status, stable for surgery.  I have reviewed the patient's chart and labs.  Questions were answered to the patient's satisfaction.     Yakelin Grenier S

## 2012-09-26 ENCOUNTER — Encounter (HOSPITAL_COMMUNITY): Payer: Self-pay | Admitting: Urology

## 2012-09-26 LAB — BASIC METABOLIC PANEL
BUN: 8 mg/dL (ref 6–23)
Chloride: 96 mEq/L (ref 96–112)
Glucose, Bld: 135 mg/dL — ABNORMAL HIGH (ref 70–99)
Potassium: 3.8 mEq/L (ref 3.5–5.1)

## 2012-09-26 NOTE — Discharge Summary (Signed)
  Date of admission: 09/25/2012  Date of discharge: 09/26/2012  Admission diagnosis: Prostate Cancer  Discharge diagnosis: Prostate Cancer  History and Physical: For full details, please see admission history and physical. Briefly, Zachary Vance is a 52 y.o. gentleman with localized prostate cancer.  After discussing management/treatment options, he elected to proceed with surgical treatment.  Hospital Course: Zachary Vance was taken to the operating room on 09/25/2012 and underwent a robotic assisted laparoscopic radical prostatectomy. He tolerated this procedure well and without complications. Postoperatively, he was able to be transferred to a regular hospital room following recovery from anesthesia.  He was able to begin ambulating the night of surgery. He remained hemodynamically stable overnight.  He had excellent urine output with appropriately minimal output from his pelvic drain and his pelvic drain was removed on POD #1.  He was transitioned to oral pain medication, tolerated a clear liquid diet, and had met all discharge criteria and was able to be discharged home later on POD#1.  Laboratory values:  Recent Labs  09/25/12 1410 09/26/12 0444  HGB 12.8* 11.2*  HCT 36.9* 33.2*    Disposition: Home  Discharge instruction: He was instructed to be ambulatory but to refrain from heavy lifting, strenuous activity, or driving. He was instructed on urethral catheter care.  Discharge medications:     Medication List    STOP taking these medications       ibuprofen 200 MG tablet  Commonly known as:  ADVIL,MOTRIN      TAKE these medications       amLODipine 2.5 MG tablet  Commonly known as:  NORVASC  Take 2.5 mg by mouth daily before breakfast.     ciprofloxacin 500 MG tablet  Commonly known as:  CIPRO  Take 1 tablet (500 mg total) by mouth 2 (two) times daily. Start day prior to office visit for foley removal     HYDROcodone-acetaminophen 5-325 MG per tablet  Commonly known  as:  NORCO  Take 1-2 tablets by mouth every 6 (six) hours as needed for pain.     losartan-hydrochlorothiazide 100-25 MG per tablet  Commonly known as:  HYZAAR  Take 1 tablet by mouth daily before breakfast.     pravastatin 20 MG tablet  Commonly known as:  PRAVACHOL  Take 20 mg by mouth daily before breakfast.        Followup: He will followup in 1 week for catheter removal and to discuss his surgical pathology results.

## 2012-09-26 NOTE — Progress Notes (Signed)
1 Day Post-Op Subjective: Patient reports pain control good.No problems overnight.  Has ambulated.  Objective: Vital signs in last 24 hours: Temp:  [97.7 F (36.5 C)-98.7 F (37.1 C)] 98.2 F (36.8 C) (03/06 0549) Pulse Rate:  [63-75] 63 (03/06 0549) Resp:  [11-17] 16 (03/06 0549) BP: (90-149)/(53-82) 99/64 mmHg (03/06 0549) SpO2:  [98 %-100 %] 98 % (03/06 0549) Weight:  [77.111 kg (170 lb)] 77.111 kg (170 lb) (03/05 1347)  Intake/Output from previous day: 03/05 0701 - 03/06 0700 In: 3383.3 [I.V.:3083.3; IV Piggyback:300] Out: 1810 [Urine:1705; Drains:105] Intake/Output this shift:    Physical Exam:  General:alert and cooperative Cardiovascular: rrr Lungs: nl effort GI: not done and soft Incisions: C/D/I Urine:clear Extremities:NT/ no edema  Lab Results:  Recent Labs  09/25/12 1410 09/26/12 0444  HGB 12.8* 11.2*  HCT 36.9* 33.2*   BMET  Recent Labs  09/26/12 0444  NA 130*  K 3.8  CL 96  CO2 27  GLUCOSE 135*  BUN 8  CREATININE 1.06  CALCIUM 8.2*   No results found for this basename: LABPT, INR,  in the last 72 hours No results found for this basename: LABURIN,  in the last 72 hours Results for orders placed during the hospital encounter of 09/16/12  SURGICAL PCR SCREEN     Status: None   Collection Time    09/16/12 10:15 AM      Result Value Range Status   MRSA, PCR NEGATIVE  NEGATIVE Final   Staphylococcus aureus NEGATIVE  NEGATIVE Final   Comment:            The Xpert SA Assay (FDA     approved for NASAL specimens     in patients over 57 years of age),     is one component of     a comprehensive surveillance     program.  Test performance has     been validated by [employer] for patients greater     than or equal to 29 year old.     It is not intended     to diagnose infection nor to     guide or monitor treatment.    Studies/Results: No results found.  Assessment/Plan: 1 Day Post-Op, Procedure(s) (LRB): ROBOTIC ASSISTED  LAPAROSCOPIC RADICAL PROSTATECTOMY (N/A)  Ambulate, Incentive spirometry D/C pelvic drain D/C home today   LOS: 1 day   Laquesha Holcomb S 09/26/2012, 7:41 AM

## 2012-11-06 ENCOUNTER — Encounter: Payer: Self-pay | Admitting: Family Medicine

## 2012-11-06 DIAGNOSIS — N529 Male erectile dysfunction, unspecified: Secondary | ICD-10-CM | POA: Insufficient documentation

## 2012-11-12 NOTE — Progress Notes (Signed)
  Subjective:    Patient ID: Zachary Vance, male    DOB: 1961/07/17, 52 y.o.   MRN: [id number] Chief Complaint  Patient presents with  . Medication Refill  . Hypertension   HPI    Review of Systems    BP 102/72  Pulse 71  Temp(Src) 98 F (36.7 C) (Oral)  Resp 16  Ht 6' (1.829 m)  Wt 169 lb 3.2 oz (76.749 kg)  BMI 22.94 kg/m2  SpO2 100% Objective:   Physical Exam        Assessment & Plan:  Other and unspecified hyperlipidemia - Plan: Lipid panel  Encounter for long-term (current) use of other medications  Unspecified essential hypertension - Plan: Comprehensive metabolic panel  No orders of the defined types were placed in this encounter.

## 2013-01-08 ENCOUNTER — Encounter: Payer: Self-pay | Admitting: Family Medicine

## 2013-01-08 MED ORDER — PRAVASTATIN SODIUM 20 MG PO TABS: 20.0000 mg | ORAL_TABLET | Freq: Every day | ORAL | Status: DC

## 2013-01-08 MED ORDER — LOSARTAN POTASSIUM-HCTZ 100-25 MG PO TABS: 1.0000 | ORAL_TABLET | Freq: Every day | ORAL | Status: DC

## 2013-01-08 NOTE — Telephone Encounter (Signed)
sent 

## 2013-02-07 ENCOUNTER — Ambulatory Visit (INDEPENDENT_AMBULATORY_CARE_PROVIDER_SITE_OTHER): Payer: PRIVATE HEALTH INSURANCE | Admitting: Family Medicine

## 2013-02-07 ENCOUNTER — Encounter: Payer: Self-pay | Admitting: Family Medicine

## 2013-02-07 VITALS — BP 120/75 | HR 74 | Temp 98.2°F | Resp 16 | Ht 73.0 in | Wt 167.0 lb

## 2013-02-07 DIAGNOSIS — C61 Malignant neoplasm of prostate: Secondary | ICD-10-CM

## 2013-02-07 DIAGNOSIS — Z79899 Other long term (current) drug therapy: Secondary | ICD-10-CM

## 2013-02-07 DIAGNOSIS — F17200 Nicotine dependence, unspecified, uncomplicated: Secondary | ICD-10-CM

## 2013-02-07 DIAGNOSIS — I10 Essential (primary) hypertension: Secondary | ICD-10-CM

## 2013-02-07 DIAGNOSIS — Z760 Encounter for issue of repeat prescription: Secondary | ICD-10-CM

## 2013-02-07 DIAGNOSIS — Z720 Tobacco use: Secondary | ICD-10-CM

## 2013-02-07 DIAGNOSIS — E785 Hyperlipidemia, unspecified: Secondary | ICD-10-CM

## 2013-02-07 LAB — CBC WITH DIFFERENTIAL/PLATELET
Basophils Relative: 1 % (ref 0–1)
HCT: 39.6 % (ref 39.0–52.0)
Hemoglobin: 13.8 g/dL (ref 13.0–17.0)
Lymphocytes Relative: 16 % (ref 12–46)
Lymphs Abs: 0.7 10*3/uL (ref 0.7–4.0)
MCHC: 34.8 g/dL (ref 30.0–36.0)
Monocytes Absolute: 0.5 10*3/uL (ref 0.1–1.0)
Monocytes Relative: 12 % (ref 3–12)
Neutro Abs: 3 10*3/uL (ref 1.7–7.7)
RBC: 4.63 MIL/uL (ref 4.22–5.81)

## 2013-02-07 MED ORDER — PRAVASTATIN SODIUM 20 MG PO TABS: 20.0000 mg | ORAL_TABLET | Freq: Every day | ORAL | Status: DC

## 2013-02-07 MED ORDER — LOSARTAN POTASSIUM-HCTZ 100-25 MG PO TABS: 1.0000 | ORAL_TABLET | Freq: Every day | ORAL | Status: DC

## 2013-02-07 MED ORDER — AMLODIPINE BESYLATE 2.5 MG PO TABS: 2.5000 mg | ORAL_TABLET | Freq: Every day | ORAL | Status: DC

## 2013-02-07 NOTE — Progress Notes (Signed)
Subjective:    Patient ID: Zachary Vance, male    DOB: November 13, 1960, 52 y.o.   MRN: [id number] Chief Complaint  Patient presents with  . Medication Refill    HPI  Zachary Vance is doing well. No questions or concerns today. Just in for med refills - he will run out tomorrow.  He has no problems with any of his medication.  He had a lap rob-asstd prostatectomy over 4 mos ago in March. Recovered fine, no concerns.  Still smoking. No interest or desire to quit.  Past Medical History  Diagnosis Date  . Hypertension   . Hyperlipidemia   . Impotence of organic origin   . 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 Marcine Matar, [facility]  . Erectile dysfunction     starting oral agents    Current Outpatient Prescriptions on File Prior to Visit  Medication Sig Dispense Refill  . tadalafil (CIALIS) 20 MG tablet Take 20 mg by mouth daily as needed for erectile dysfunction.       No current facility-administered medications on file prior to visit.   Allergies  Allergen Reactions  . Lisinopril Other (See Comments)    Makes him feel sick     Review of Systems  Constitutional: Negative for fever and chills.  Eyes: Negative for visual disturbance.  Respiratory: Negative for shortness of breath.   Cardiovascular: Negative for chest pain and leg swelling.  Neurological: Negative for dizziness, syncope, facial asymmetry, weakness, light-headedness and headaches.      BP 120/75  Pulse 74  Temp(Src) 98.2 F (36.8 C) (Oral)  Resp 16  Ht 6\' 1"  (1.854 m)  Wt 167 lb (75.751 kg)  BMI 22.04 kg/m2  SpO2 99% Objective:   Physical Exam  Constitutional: He is oriented to person, place, and time. He appears well-developed and well-nourished. No distress.  HENT:  Head: Normocephalic and atraumatic.  Eyes: Conjunctivae are normal. Pupils are equal, round, and reactive to light. No scleral icterus.  Cardiovascular: Normal rate, regular rhythm, normal heart sounds and intact distal  pulses.   Pulmonary/Chest: Effort normal and breath sounds normal. No respiratory distress.  Musculoskeletal: He exhibits no edema.  Neurological: He is alert and oriented to person, place, and time.  Skin: Skin is warm and dry. He is not diaphoretic.  Psychiatric: He has a normal mood and affect. His behavior is normal.      Assessment & Plan:  Hypertension - well controlled, cont current regiemin  Hyperlipemia - Plan: Lipid panel - On his current regimen, last lipid panel on pravastatin showed pt had a 11.3% 10 yr risk of CVD. And 69% lifetime risk so ASCVD risk cohort recommended pravastatin 40-80mg  for moderate to high intensity statin.  Encounter for long-term (current) use of other medications - Plan: Comprehensive metabolic panel - chronically low na but K was ok, recheck  Adenocarcinoma of prostate - Plan: POCT CBC - anemic after surgery, recheck  Tobacco abuse - Plan: POCT CBC  Medication refill  Meds ordered this encounter  Medications  . amLODipine (NORVASC) 2.5 MG tablet    Sig: Take 1 tablet (2.5 mg total) by mouth daily before breakfast.    Dispense:  90 tablet    Refill:  3  . losartan-hydrochlorothiazide (HYZAAR) 100-25 MG per tablet    Sig: Take 1 tablet by mouth daily before breakfast.    Dispense:  90 tablet    Refill:  3  . pravastatin (PRAVACHOL) 20 MG tablet    Sig:  Take 1 tablet (20 mg total) by mouth daily before breakfast.    Dispense:  90 tablet    Refill:  3

## 2013-02-08 LAB — COMPREHENSIVE METABOLIC PANEL
AST: 17 U/L (ref 0–37)
BUN: 14 mg/dL (ref 6–23)
Calcium: 9.4 mg/dL (ref 8.4–10.5)
Chloride: 98 mEq/L (ref 96–112)
Creat: 1.05 mg/dL (ref 0.50–1.35)

## 2013-02-08 LAB — LIPID PANEL
Cholesterol: 223 mg/dL — ABNORMAL HIGH (ref 0–200)
HDL: 53 mg/dL
LDL Cholesterol: 140 mg/dL — ABNORMAL HIGH (ref 0–99)
Total CHOL/HDL Ratio: 4.2 ratio
Triglycerides: 150 mg/dL — ABNORMAL HIGH
VLDL: 30 mg/dL (ref 0–40)

## 2013-02-10 MED ORDER — PRAVASTATIN SODIUM 40 MG PO TABS: 40.0000 mg | ORAL_TABLET | Freq: Every day | ORAL | Status: DC

## 2013-02-10 NOTE — Addendum Note (Signed)
Addended by: Norberto Sorenson on: 02/10/2013 01:26 PM   Modules accepted: Orders

## 2013-05-16 ENCOUNTER — Encounter: Payer: Self-pay | Admitting: Family Medicine

## 2013-05-16 ENCOUNTER — Ambulatory Visit (INDEPENDENT_AMBULATORY_CARE_PROVIDER_SITE_OTHER): Payer: PRIVATE HEALTH INSURANCE | Admitting: Family Medicine

## 2013-05-16 VITALS — BP 110/72 | HR 72 | Temp 98.0°F | Resp 16 | Ht 71.75 in | Wt 164.0 lb

## 2013-05-16 DIAGNOSIS — Z79899 Other long term (current) drug therapy: Secondary | ICD-10-CM | POA: Diagnosis not present

## 2013-05-16 DIAGNOSIS — Z23 Encounter for immunization: Secondary | ICD-10-CM

## 2013-05-16 DIAGNOSIS — Z760 Encounter for issue of repeat prescription: Secondary | ICD-10-CM

## 2013-05-16 DIAGNOSIS — I10 Essential (primary) hypertension: Secondary | ICD-10-CM

## 2013-05-16 DIAGNOSIS — E785 Hyperlipidemia, unspecified: Secondary | ICD-10-CM | POA: Diagnosis not present

## 2013-05-16 LAB — LIPID PANEL
Cholesterol: 206 mg/dL — ABNORMAL HIGH (ref 0–200)
HDL: 51 mg/dL (ref >39)
Triglycerides: 160 mg/dL — ABNORMAL HIGH (ref <150)

## 2013-05-16 LAB — COMPREHENSIVE METABOLIC PANEL
Albumin: 4.6 g/dL (ref 3.5–5.2)
BUN: 16 mg/dL (ref 6–23)
CO2: 27 mEq/L (ref 19–32)
Calcium: 9.4 mg/dL (ref 8.4–10.5)
Chloride: 97 mEq/L (ref 96–112)
Glucose, Bld: 82 mg/dL (ref 70–99)
Potassium: 3.6 mEq/L (ref 3.5–5.3)
Total Protein: 7.2 g/dL (ref 6.0–8.3)

## 2013-05-16 MED ORDER — AMLODIPINE BESYLATE 2.5 MG PO TABS: 2.5000 mg | ORAL_TABLET | Freq: Every day | ORAL | Status: AC

## 2013-05-16 MED ORDER — LOSARTAN POTASSIUM-HCTZ 100-25 MG PO TABS: 1.0000 | ORAL_TABLET | Freq: Every day | ORAL | Status: AC

## 2013-05-16 NOTE — Progress Notes (Signed)
  Subjective:    Patient ID: Zachary Vance, male    DOB: 11-Jun-1961, 52 y.o.   MRN: [id number]  HPI   Doing well. No complaints or concerns today.  After his last OV, we increased his pravastatin from 20 to 40. Has had not had any side effects from this.  Past Medical History  Diagnosis Date  . Hypertension   . Hyperlipidemia   . Impotence of organic origin   . 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 Marcine Matar, [facility]  . Erectile dysfunction     starting oral agents    Current Outpatient Prescriptions on File Prior to Visit  Medication Sig Dispense Refill  . pravastatin (PRAVACHOL) 40 MG tablet Take 1 tablet (40 mg total) by mouth at bedtime.  90 tablet  2  . tadalafil (CIALIS) 20 MG tablet Take 20 mg by mouth daily as needed for erectile dysfunction.       No current facility-administered medications on file prior to visit.   Allergies  Allergen Reactions  . Lisinopril Other (See Comments)    Makes him feel sick    Review of Systems  Constitutional: Negative for fever and chills.  Eyes: Negative for visual disturbance.  Respiratory: Negative for shortness of breath.   Cardiovascular: Negative for chest pain and leg swelling.  Musculoskeletal: Negative for arthralgias, gait problem, joint swelling and myalgias.  Neurological: Negative for dizziness, syncope, facial asymmetry, weakness, light-headedness and headaches.      BP 110/72  Pulse 72  Temp(Src) 98 F (36.7 C) (Oral)  Resp 16  Ht 5' 11.75" (1.822 m)  Wt 164 lb (74.39 kg)  BMI 22.41 kg/m2  SpO2 100% Objective:   Physical Exam  Constitutional: He is oriented to person, place, and time. He appears well-developed and well-nourished. No distress.  HENT:  Head: Normocephalic and atraumatic.  Eyes: Conjunctivae are normal. Pupils are equal, round, and reactive to light. No scleral icterus.  Cardiovascular: Normal rate, regular rhythm, normal heart sounds and intact distal pulses.    Pulmonary/Chest: Effort normal and breath sounds normal. No respiratory distress.  Musculoskeletal: He exhibits no edema.  Neurological: He is alert and oriented to person, place, and time.  Skin: Skin is warm and dry. He is not diaphoretic.  Psychiatric: He has a normal mood and affect. His behavior is normal.      Assessment & Plan:   Need for prophylactic vaccination and inoculation against influenza - Plan: Flu Vaccine QUAD 36+ mos IM  Medication refill  Hypertension - Plan: TSH - BP doing great! Cont on current reg.  Hyperlipemia - Plan: Lipid panel - recheck to ensure pt is at goal at pravastatin 40mg  qhs - refill after lipids come back  Encounter for long-term (current) use of other medications - Plan: Comprehensive metabolic panel, TSH  Recheck pt in 1 yr.  Meds ordered this encounter  Medications  . amLODipine (NORVASC) 2.5 MG tablet    Sig: Take 1 tablet (2.5 mg total) by mouth daily before breakfast.    Dispense:  90 tablet    Refill:  3  . losartan-hydrochlorothiazide (HYZAAR) 100-25 MG per tablet    Sig: Take 1 tablet by mouth daily before breakfast.    Dispense:  90 tablet    Refill:  3   Norberto Sorenson, MD MPH

## 2013-05-21 MED ORDER — ATORVASTATIN CALCIUM 40 MG PO TABS: 40.0000 mg | ORAL_TABLET | Freq: Every day | ORAL | Status: AC

## 2013-05-21 NOTE — Addendum Note (Signed)
Addended by: Norberto Sorenson on: 05/21/2013 01:50 PM   Modules accepted: Orders, Medications

## 2013-06-13 ENCOUNTER — Other Ambulatory Visit: Payer: Self-pay | Admitting: Physician Assistant

## 2013-07-22 ENCOUNTER — Other Ambulatory Visit: Payer: Self-pay | Admitting: Family Medicine

## 2013-10-05 IMAGING — CR DG CHEST 2V
2 series · 2 of 2 positions shown · non-contrast
Comparison: None.

CLINICAL DATA: Hypertension.Preoperative radiograph.

CHEST - 2 VIEW

[w chest pa]
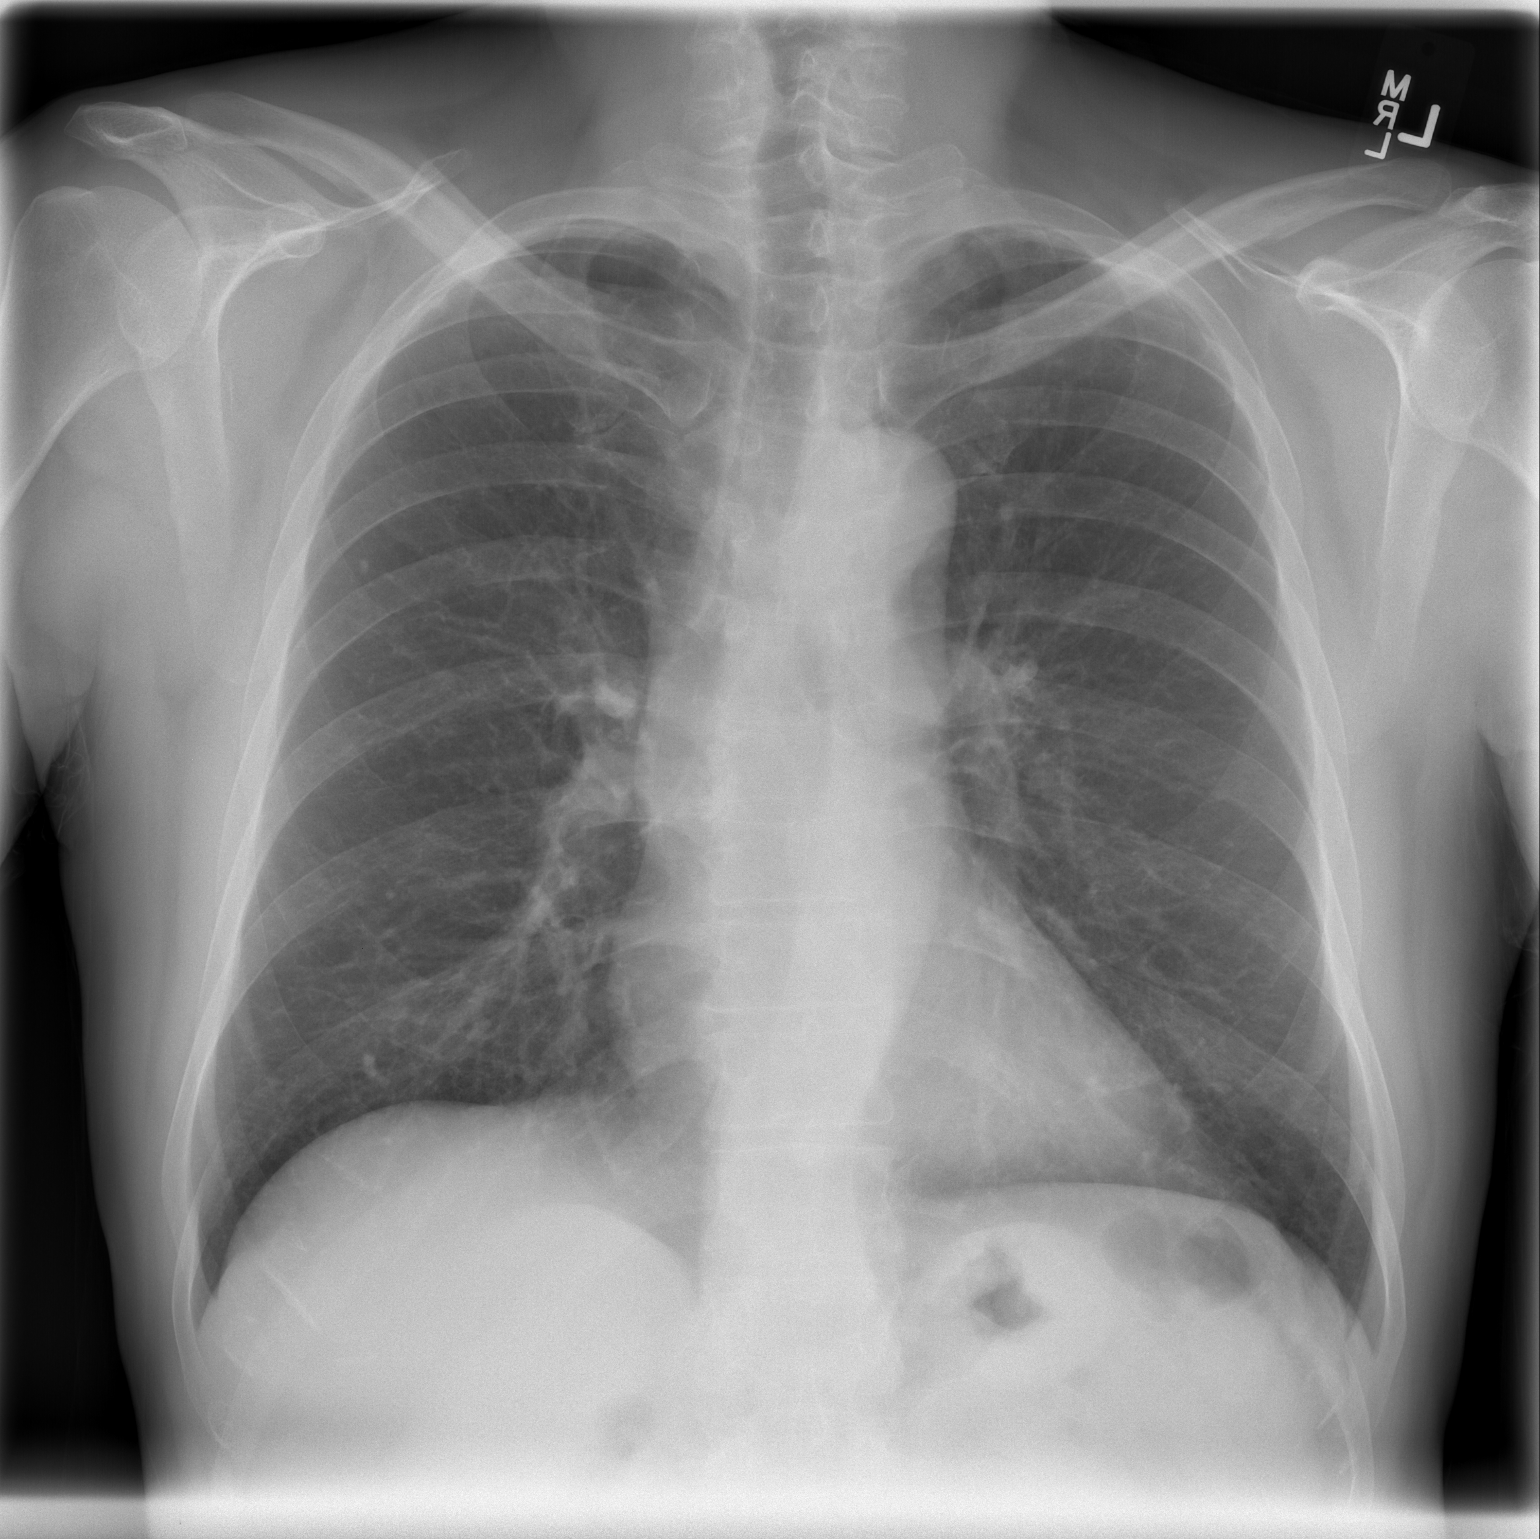

[w chest lat]
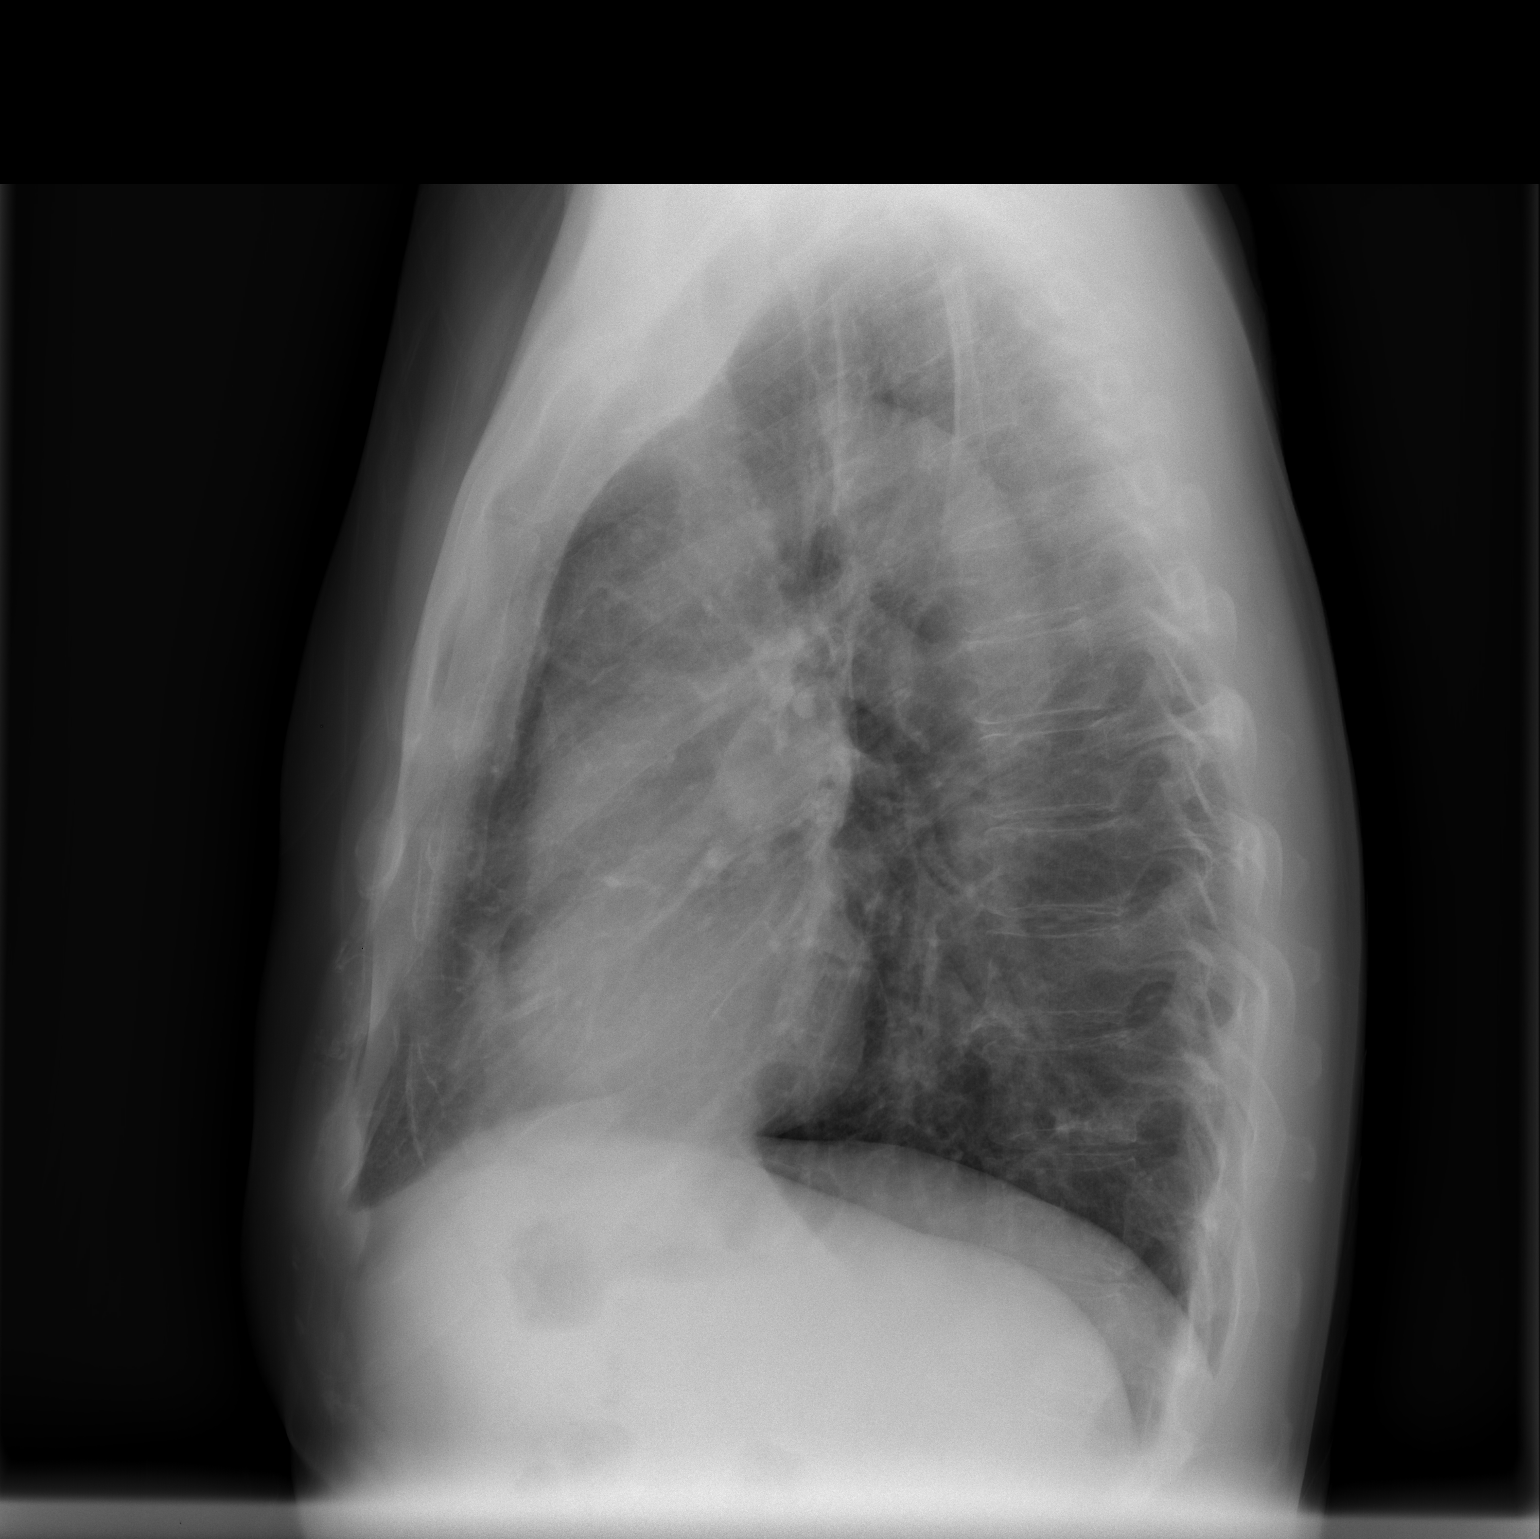

[2 of 2 positions shown; findings below may reference images not displayed]

FINDINGS: 5 mm nodule is present in the right upper lobe.  Follow-
up noncontrast chest CT recommended for further assessment.  Mild
bilateral pleural apical scarring.  Cardiopericardial silhouette
and mediastinal contours are within normal limits.  There is no
airspace disease or effusion.
IMPRESSION: 1.  No acute cardiopulmonary disease.
2.  5 mm right upper lobe pulmonary nodule.  Follow-up noncontrast
chest CT recommended.

## 2013-11-18 ENCOUNTER — Telehealth: Payer: Self-pay | Admitting: Physician Assistant

## 2013-11-18 NOTE — Telephone Encounter (Signed)
Call from [facility].  Patient found unresponsive on the floor at home, with rigor and lividity estimating TOD about 6 hours previously.  Asked if PCP, Dr. Brigitte Pulse would sign the death certificate.  Based on chart review, Dr. Brigitte Pulse advises no obvious cause of death.  Case should be sent to the [facility].

## 2013-11-20 ENCOUNTER — Telehealth: Payer: Self-pay

## 2013-11-20 NOTE — Telephone Encounter (Addendum)
[facility] dropped of death certificate for Dr. Brigitte Pulse to sign. States that they would like to be able to pick this up tomorrow if possible. Please call [phone] for further information. I have placed this in Dr. Raul Del box with a note attached.

## 2013-11-21 DEATH — deceased

## 2013-11-23 NOTE — Telephone Encounter (Signed)
Completed and placed in nurse inbox on 5/1

## 2013-11-24 NOTE — Telephone Encounter (Signed)
Call to [facility] advising death certificate is ready to be picked up.

## 2019-05-02 ENCOUNTER — Telehealth: Payer: Self-pay | Admitting: *Deleted

## 2019-05-02 NOTE — Telephone Encounter (Signed)
Entered in error
